# Patient Record
Sex: Female | Born: 1950 | Race: Black or African American | Hispanic: No | Smoking: Current every day smoker
Health system: Southern US, Community
[De-identification: ages and names within clinical notes are randomized; demographics above are authoritative.]

## PROBLEM LIST (undated history)

## (undated) DIAGNOSIS — S069X9A Unspecified intracranial injury with loss of consciousness of unspecified duration, initial encounter: Secondary | ICD-10-CM

## (undated) DIAGNOSIS — R569 Unspecified convulsions: Secondary | ICD-10-CM

## (undated) DIAGNOSIS — S069XAA Unspecified intracranial injury with loss of consciousness status unknown, initial encounter: Secondary | ICD-10-CM

---

## 2014-02-05 ENCOUNTER — Emergency Department (HOSPITAL_COMMUNITY): Payer: Medicaid Other

## 2014-02-05 ENCOUNTER — Encounter (HOSPITAL_COMMUNITY): Payer: Self-pay | Admitting: Emergency Medicine

## 2014-02-05 ENCOUNTER — Observation Stay (HOSPITAL_COMMUNITY): Payer: Medicaid Other

## 2014-02-05 ENCOUNTER — Observation Stay (HOSPITAL_COMMUNITY)
Admission: EM | Admit: 2014-02-05 | Discharge: 2014-02-06 | Disposition: A | Discharge status: Home / Self Care | Payer: Medicaid Other | Attending: Internal Medicine | Admitting: Internal Medicine

## 2014-02-05 DIAGNOSIS — R030 Elevated blood-pressure reading, without diagnosis of hypertension: Secondary | ICD-10-CM | POA: Diagnosis not present

## 2014-02-05 DIAGNOSIS — IMO0001 Reserved for inherently not codable concepts without codable children: Secondary | ICD-10-CM

## 2014-02-05 DIAGNOSIS — Z87820 Personal history of traumatic brain injury: Secondary | ICD-10-CM | POA: Diagnosis not present

## 2014-02-05 DIAGNOSIS — F17200 Nicotine dependence, unspecified, uncomplicated: Secondary | ICD-10-CM | POA: Insufficient documentation

## 2014-02-05 DIAGNOSIS — G40309 Generalized idiopathic epilepsy and epileptic syndromes, not intractable, without status epilepticus: Secondary | ICD-10-CM | POA: Diagnosis not present

## 2014-02-05 DIAGNOSIS — G40901 Epilepsy, unspecified, not intractable, with status epilepticus: Secondary | ICD-10-CM

## 2014-02-05 DIAGNOSIS — R569 Unspecified convulsions: Secondary | ICD-10-CM | POA: Diagnosis present

## 2014-02-05 DIAGNOSIS — G40401 Other generalized epilepsy and epileptic syndromes, not intractable, with status epilepticus: Secondary | ICD-10-CM

## 2014-02-05 DIAGNOSIS — R561 Post traumatic seizures: Secondary | ICD-10-CM

## 2014-02-05 DIAGNOSIS — M62421 Contracture of muscle, right upper arm: Secondary | ICD-10-CM

## 2014-02-05 HISTORY — DX: Unspecified intracranial injury with loss of consciousness status unknown, initial encounter: S06.9XAA

## 2014-02-05 HISTORY — DX: Unspecified intracranial injury with loss of consciousness of unspecified duration, initial encounter: S06.9X9A

## 2014-02-05 HISTORY — DX: Unspecified convulsions: R56.9

## 2014-02-05 LAB — URINALYSIS, ROUTINE W REFLEX MICROSCOPIC
BILIRUBIN URINE: NEGATIVE
Glucose, UA: NEGATIVE mg/dL
HGB URINE DIPSTICK: NEGATIVE
Ketones, ur: NEGATIVE mg/dL
Leukocytes, UA: NEGATIVE
Nitrite: NEGATIVE
PROTEIN: NEGATIVE mg/dL
Specific Gravity, Urine: 1.008 (ref 1.005–1.030)
Urobilinogen, UA: 0.2 mg/dL (ref 0.0–1.0)
pH: 8 (ref 5.0–8.0)

## 2014-02-05 LAB — CBC WITH DIFFERENTIAL/PLATELET
Basophils Absolute: 0 10*3/uL (ref 0.0–0.1)
Basophils Relative: 0 % (ref 0–1)
Eosinophils Absolute: 0 10*3/uL (ref 0.0–0.7)
Eosinophils Relative: 1 % (ref 0–5)
HCT: 44.7 % (ref 36.0–46.0)
HEMOGLOBIN: 14.7 g/dL (ref 12.0–15.0)
LYMPHS ABS: 1.4 10*3/uL (ref 0.7–4.0)
Lymphocytes Relative: 18 % (ref 12–46)
MCH: 32.1 pg (ref 26.0–34.0)
MCHC: 32.9 g/dL (ref 30.0–36.0)
MCV: 97.6 fL (ref 78.0–100.0)
Monocytes Absolute: 0.3 10*3/uL (ref 0.1–1.0)
Monocytes Relative: 4 % (ref 3–12)
NEUTROS PCT: 77 % (ref 43–77)
Neutro Abs: 6.1 10*3/uL (ref 1.7–7.7)
PLATELETS: 258 10*3/uL (ref 150–400)
RBC: 4.58 MIL/uL (ref 3.87–5.11)
RDW: 14 % (ref 11.5–15.5)
WBC: 7.9 10*3/uL (ref 4.0–10.5)

## 2014-02-05 LAB — COMPREHENSIVE METABOLIC PANEL
ALK PHOS: 83 U/L (ref 39–117)
ALT: 6 U/L (ref 0–35)
AST: 11 U/L (ref 0–37)
Albumin: 4.3 g/dL (ref 3.5–5.2)
Anion gap: 16 — ABNORMAL HIGH (ref 5–15)
BILIRUBIN TOTAL: 0.3 mg/dL (ref 0.3–1.2)
BUN: 6 mg/dL (ref 6–23)
CO2: 25 meq/L (ref 19–32)
Calcium: 9.4 mg/dL (ref 8.4–10.5)
Chloride: 99 mEq/L (ref 96–112)
Creatinine, Ser: 0.71 mg/dL (ref 0.50–1.10)
GLUCOSE: 94 mg/dL (ref 70–99)
POTASSIUM: 3.7 meq/L (ref 3.7–5.3)
SODIUM: 140 meq/L (ref 137–147)
Total Protein: 7.6 g/dL (ref 6.0–8.3)

## 2014-02-05 MED ORDER — METOPROLOL TARTRATE 25 MG PO TABS: 50.0000 mg | ORAL_TABLET | Freq: Once | ORAL | Status: DC

## 2014-02-05 MED ORDER — METOPROLOL TARTRATE 1 MG/ML IV SOLN
5.0000 mg | Freq: Once | INTRAVENOUS | Status: AC
  Administered 2014-02-05: 5 mg via INTRAVENOUS
  Filled 2014-02-05: qty 5

## 2014-02-05 MED ORDER — SODIUM CHLORIDE 0.9 % IV SOLN
INTRAVENOUS | Status: AC
  Administered 2014-02-05: via INTRAVENOUS

## 2014-02-05 MED ORDER — SODIUM CHLORIDE 0.9 % IJ SOLN
3.0000 mL | Freq: Two times a day (BID) | INTRAMUSCULAR | Status: DC
  Administered 2014-02-05: 3 mL via INTRAVENOUS

## 2014-02-05 MED ORDER — LEVETIRACETAM 500 MG PO TABS
1000.0000 mg | ORAL_TABLET | Freq: Three times a day (TID) | ORAL | Status: DC
  Administered 2014-02-06 (×2): 1000 mg via ORAL
  Filled 2014-02-05 (×2): qty 2

## 2014-02-05 MED ORDER — LORAZEPAM 2 MG/ML IJ SOLN: 1.0000 mg | INTRAMUSCULAR | Status: DC | PRN

## 2014-02-05 MED ORDER — ENOXAPARIN SODIUM 40 MG/0.4ML ~~LOC~~ SOLN
40.0000 mg | SUBCUTANEOUS | Status: DC
  Administered 2014-02-06: 40 mg via SUBCUTANEOUS
  Filled 2014-02-05: qty 0.4

## 2014-02-05 MED ORDER — LEVETIRACETAM IN NACL 1000 MG/100ML IV SOLN
1000.0000 mg | Freq: Once | INTRAVENOUS | Status: AC
  Administered 2014-02-05: 1000 mg via INTRAVENOUS
  Filled 2014-02-05: qty 100

## 2014-02-05 MED ORDER — ONDANSETRON HCL 4 MG PO TABS: 4.0000 mg | ORAL_TABLET | Freq: Four times a day (QID) | ORAL | Status: DC | PRN

## 2014-02-05 MED ORDER — LORAZEPAM 2 MG/ML IJ SOLN
1.0000 mg | Freq: Once | INTRAMUSCULAR | Status: AC
  Administered 2014-02-05: 1 mg via INTRAVENOUS
  Filled 2014-02-05: qty 1

## 2014-02-05 MED ORDER — LORAZEPAM 2 MG/ML IJ SOLN
1.0000 mg | Freq: Once | INTRAMUSCULAR | Status: AC
  Administered 2014-02-05: 1 mg via INTRAVENOUS

## 2014-02-05 MED ORDER — ONDANSETRON HCL 4 MG/2ML IJ SOLN: 4.0000 mg | Freq: Four times a day (QID) | INTRAMUSCULAR | Status: DC | PRN

## 2014-02-05 NOTE — ED Provider Notes (Signed)
CSN: [id number]     Arrival date & time 02/05/14  1730 History   First MD Initiated Contact with Patient 02/05/14 1735     Chief Complaint  Patient presents with  . Seizures     (Consider location/radiation/quality/duration/timing/severity/associated sxs/prior Treatment) HPI Comments: Patient is a 63 year old female with history of traumatic brain injury many years ago, seizure disorder. She is being treated with Keppra and has been seizure-free for quite some time. She is brought today for evaluation of seizure that occurred at home. EMS was called and the patient was given Versed with resolution. Shortly after arriving here she experienced another episode and was given Ativan. 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  Patient is a 63 y.o. female presenting with seizures. The history is provided by the patient.  Seizures Seizure activity on arrival: no   Seizure type:  Grand mal Initial focality:  None Episode characteristics: confusion, focal shaking, incontinence and stiffening   Severity:  Moderate Duration:  5 minutes Timing:  Clustered Progression:  Worsening   Past Medical History  Diagnosis Date  . Seizures   . TBI (traumatic brain injury)    No past surgical history on file. No family history on file. History  Substance Use Topics  . Smoking status: Not on file  . Smokeless tobacco: Not on file  . Alcohol Use: Not on file   OB History   Grav Para Term Preterm Abortions TAB SAB Ect Mult Living                 Review of Systems  Neurological: Positive for seizures.  All other systems reviewed and are negative.     Allergies  Review of patient's allergies indicates no known allergies.  Home Medications   Prior to Admission medications   Medication Sig Start Date End Date Taking? Authorizing Provider  levETIRAcetam (KEPPRA) 750 MG tablet Take 750 mg by mouth 3 (three) times daily.   Yes Historical Provider,  MD   BP 193/100  Pulse 111  Temp(Src) 97.9 F (36.6 C) (Oral)  Resp 28  SpO2 94% Physical Exam  Nursing note and vitals reviewed. Constitutional: She is oriented to person, place, and time.  Patient is a 63 year old female in no acute distress. She appears chronically ill.  HENT:  Head: Normocephalic and atraumatic.  Mouth/Throat: Oropharynx is clear and moist.  Eyes: Pupils are equal, round, and reactive to light.  Neck: Normal range of motion. Neck supple.  Cardiovascular: Normal rate, regular rhythm and normal heart sounds.   No murmur heard. Pulmonary/Chest: Effort normal and breath sounds normal. No respiratory distress.  Abdominal: Soft. Bowel sounds are normal. She exhibits no distension. There is no tenderness.  Musculoskeletal: Normal range of motion. She exhibits no edema.  Neurological: She is alert and oriented to person, place, and time. No cranial nerve deficit.  Patient has a baseline hemiplegia on the right. Neurologic exam is somewhat difficult due to to postictal state.  Skin: Skin is warm and dry.    ED Course  Procedures (including critical care time) Labs Review Labs Reviewed  COMPREHENSIVE METABOLIC PANEL - Abnormal; Notable for the following:    Anion gap 16 (*)    All other components within normal limits  CBC WITH DIFFERENTIAL  URINALYSIS, ROUTINE W REFLEX MICROSCOPIC    Imaging Review Ct Head Wo Contrast  02/05/2014   CLINICAL DATA:  SEIZURES  EXAM: CT HEAD WITHOUT CONTRAST  TECHNIQUE: Contiguous axial images  were obtained from the base of the skull through the vertex without intravenous contrast.  COMPARISON:  10/13/2010  FINDINGS: Atherosclerotic and physiologic intracranial calcifications. Previous left parietal craniotomy. Stable encephalomalacia in the left posterior frontal, temporal, and parietal regions .  There is no evidence of acute intracranial hemorrhage, brain edema, mass lesion, acute infarction, mass effect, or midline shift. Acute  infarct may be inapparent on noncontrast CT. No other intra-axial abnormalities are seen. No abnormal extra-axial fluid collections or masses are identified. No significant calvarial abnormality.  IMPRESSION: 1. Negative for bleed or other acute intracranial process. 2. Changes of left parietal craniotomy with stable underlying encephalomalacia.   Electronically Signed   By: Oley Balmaniel  Hassell M.D.   On: 02/05/2014 21:17     EKG Interpretation None      MDM   Final diagnoses:  None    The patient has had recurrent seizures since arriving in the emergency department. She's been given multiple doses of Ativan. I found no obvious reason for her dramatic increase in seizure activity this evening. Her laboratory studies are unremarkable and CT of the head is unremarkable as well. There is no evidence for infection in the urine and chest x-ray is pending. Patient has been evaluated by Dr. Amada JupiterKirkpatrick from neurology who feels as though admission to medicine is indicated.  CRITICAL CARE Performed by: Geoffery LyonseLo, Landyn Buckalew Total critical care time: 45 minutes Critical care time was exclusive of separately billable procedures and treating other patients. Critical care was necessary to treat or prevent imminent or life-threatening deterioration. Critical care was time spent personally by me on the following activities: development of treatment plan with patient and/or surrogate as well as nursing, discussions with consultants, evaluation of patient's response to treatment, examination of patient, obtaining history from patient or surrogate, ordering and performing treatments and interventions, ordering and review of laboratory studies, ordering and review of radiographic studies, pulse oximetry and re-evaluation of patient's condition.    Geoffery Lyonsouglas Rakel Junio, MD 02/05/14 2213

## 2014-02-05 NOTE — ED Notes (Signed)
EDP at bedside  

## 2014-02-05 NOTE — Consult Note (Signed)
Neurology Consultation Reason for Consult: Seizures Referring Physician: Judd Lien, D  CC: Seizures  History is obtained from: Patient  HPI: Rebecca Mccormick is a 63 y.o. female with a history of seizure disorder secondary to previous traumatic brain injury who presents with recurrent seizures. She currently takes Keppra 750 3 times a day. She has not missed any doses.  Today, she had several seizures. She was found at home having shaking of both legs and EMS was called. After EMS arrival, she is continuing to have seizures and was given 2.5 mg of Versed with resolution the seizure. After arrival to the emergency department, she again had seizure activity and was given 1 mg IV Ativan. She she then had another seizure, and another milligram of IV Ativan was given again with resolution.  She currently remains postictal, but is improving  and following commands.  ROS:  Unable to obtain due to altered mental status.   Past Medical History  Diagnosis Date  . Seizures   . TBI (traumatic brain injury)     Family History: Unable to assess secondary to patient's altered mental status.    Social History: Tob: Unable to assess secondary to patient's altered mental status.    Exam: Current vital signs: BP 190/102  Pulse 88  Temp(Src) 97.9 F (36.6 C) (Oral)  Resp 23  SpO2 98% Vital signs in last 24 hours: Temp:  [97.9 F (36.6 C)] 97.9 F (36.6 C) (07/24 1744) Pulse Rate:  [83-136] 88 (07/24 2216) Resp:  [15-28] 23 (07/24 2216) BP: (156-254)/(86-148) 190/102 mmHg (07/24 2216) SpO2:  [92 %-98 %] 98 % (07/24 2216)  General: In bed, NAD CV: Regular rate and rhythm Mental Status: Patient is drowsy but easily arousable, she briskly follows commands with left side including showing 2 fingers and showing thumbsup. She does not follow commands with the right side. She does stick out her tongue to command. Her speech is dysarthric and I am unable to understand her. She does not clearly nod  or shake head in response to questions. Cranial Nerves: II: She blinks to threat from the left but not right. Pupils are equal, round, and reactive to light.  Discs are difficult to visualize. III,IV, VI: She has a right gaze preference, but does cross midline to the left when fixating the examiner, but does not fully look to the left. V:VII: Blinks to eyelid stimulation bilaterally VIII, X, XI, XII: Unable to assess secondary to patient's altered mental status.  Motor: She has a spastic right hemiparesis, but does withdraw the right leg to noxious stimuli. She moves the left side spontaneously and appears to have good strength. Sensory: She responds to noxious stimuli in the right leg and both left arms Deep Tendon Reflexes: Difficult to elicit on the spastic side, no clonus, 2+ on the left side Cerebellar: She touches my finger without clear dysmetria, but is unable to touch her nose with her left arm do to multiple monitors and IVs. Gait: Not tested due to mental status   I have reviewed labs in epic and the results pertinent to this consultation are: CMP-unremarkable  I have reviewed the images obtained: CT head-previous encephalomalacia  Impression: 63 year old female with known seizure disorder from previous traumatic brain injury who presents with multiple recurrent seizures today. She has had a cough, and a URI could lower her seizure threshold and may be responsible for her worsening. She has some room to move on her Keppra, and I would favor maximizing this agent prior to  adding a second agent.  Recommendations: 1) Keppra 1 g 3 times a day 2) if further seizures occur, would give additional Ativan 1 mg IV, and would add a second agent. 3) neurology will continue to follow.  Rebecca SlotMcNeill Kirkpatrick, MD [facility] [phone]  If 7pm- 7am, please page neurology on call as listed in [facility].

## 2014-02-05 NOTE — ED Notes (Addendum)
Per EMS, pt has h/o seizures. Daughter called. Pt was laying on mattress,focal seizure, facial twitching, left sided gaze, left hip focal. Unknown of when seizure started. Pt responds to verbal. 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 Intial BP 176/108, HR 109, 93% Rm air, CBG 100. IV started in route, 18G LAC, 2.5mg  Versed given. Nasal airway placed in left nostril. EMS suctioned pt.

## 2014-02-05 NOTE — H&P (Signed)
Date: 02/05/2014               Patient Name:  Rebecca Mccormick MRN: [id number]  DOB: 27-Apr-1951 Age / Sex: 63 y.o., female   PCP: Mia Creek, MD         Medical Service: Internal Medicine Teaching Service         Attending Physician: Dr. Rocco Serene, MD    First Contact: Dr. Beckie Salts Pager: [phone]  Second Contact: Dr. Burtis Junes Pager: [phone]       After Hours (After 5p/  First Contact Pager: [phone]  weekends / holidays): Second Contact Pager: [phone]   Chief Complaint: Seizures  History of Present Illness: Ms. Sorto is a 63 y.o. Female with PMHx of TBI s/p L parietal craniotomy in 1997 and history of seizures as a result of TBI who presents to the ED after a seizure. HPI and ROS are taken from the pt's daughters. Pt's daughter walked into her mother's room around 4 pm and noticed she was seizing on her bed. Daughter describes seizure as a stiffening of her legs, facial twitching, with her head rotated to the right. The seizure lasted for about 8 minutes from the time it was initially witnessed. EMS was called and seizure broke sometime with EMS. Pt did not have any urinary or bowel incontinence. No fall, pt was already laying down. No biting of tongue, pt is edentulous. Pt has a seizure disorder since 1997 that is a result of her TBI. Pt's daughters states her last seizure was about 1 year ago and similar in nature. The inciting factor at the time was not know. She has had numerous seizures in the past, too many to count; however, she is currently well controlled on Keppra 750 mg TID at home. Daughters state that she is very compliant with her medication. Pt smokes tobacco 1/2 ppd, but denies any alcohol or illicit drug use. Pt has had a cold for the past few days with a productive cough and possible fever.   Meds: Current Facility-Administered Medications  Medication Dose Route Frequency Provider Last Rate Last Dose  . 0.9 %  sodium chloride infusion   Intravenous Continuous Courtney Paris, MD      . enoxaparin (LOVENOX) injection 40 mg  40 mg Subcutaneous Q24H Courtney Paris, MD      . Melene Muller ON 02/06/2014] levETIRAcetam (KEPPRA) tablet 1,000 mg  1,000 mg Oral TID Courtney Paris, MD      . LORazepam (ATIVAN) injection 1-2 mg  1-2 mg Intravenous Q2H PRN Courtney Paris, MD      . ondansetron [facility]) tablet 4 mg  4 mg Oral Q6H PRN Courtney Paris, MD       Or  . ondansetron [facility]) injection 4 mg  4 mg Intravenous Q6H PRN Courtney Paris, MD      . sodium chloride 0.9 % injection 3 mL  3 mL Intravenous Q12H Courtney Paris, MD       Current Outpatient Prescriptions  Medication Sig Dispense Refill  . levETIRAcetam (KEPPRA) 750 MG tablet Take 750 mg by mouth 3 (three) times daily.        Allergies: Allergies as of 02/05/2014  . (No Known Allergies)   Past Medical History  Diagnosis Date  . Seizures   . TBI (traumatic brain injury)    No past surgical history on file. No family history on file. History   Social History  . Marital Status: Legally Separated  Spouse Name: N/A    Number of Children: N/A  . Years of Education: N/A   Occupational History  . Not on file.   Social History Main Topics  . Smoking status: Not on file  . Smokeless tobacco: Not on file  . Alcohol Use: Not on file  . Drug Use: Not on file  . Sexual Activity: Not on file   Other Topics Concern  . Not on file   Social History Narrative  . No narrative on file    Review of Systems: Obtained from daughters General:  Admits to fever, Denies chills, diaphoresis, appetite change and fatigue.  Respiratory: Admits to productive cough, Denies SOB,  and wheezing.   Cardiovascular: Denies chest pain and palpitations.  Gastrointestinal: Denies nausea, vomiting, abdominal pain, diarrhea, constipation, blood in stool and abdominal distention.  Genitourinary: Denies dysuria, urgency, frequency, hematuria, and flank pain. Endocrine: Denies hot or cold intolerance, polyuria, and  polydipsia. Musculoskeletal: Denies myalgias, back pain, joint swelling, arthralgias and gait problem.  Skin: Denies pallor, rash and wounds.  Neurological: Admits to seizure. Denies dizziness, fall, syncope, weakness, lightheadedness, numbness and headaches.  Psychiatric/Behavioral: Denies mood changes, confusion, nervousness, sleep disturbance and agitation.  Physical Exam: Filed Vitals:   02/05/14 2145 02/05/14 2200 02/05/14 2216 02/05/14 2230  BP: 185/105 197/93 190/102 193/96  Pulse: 107 102 88 86  Temp:      TempSrc:      Resp: 28 28 23 22   SpO2: 95% 94% 98% 97%   General: Vital signs reviewed.  Patient is a well-developed and well-nourished, in no acute distress and lethargic during exam.  Head: Normocephalic and atraumatic. Eyes: EOMI, conjunctivae normal, No scleral icterus.  Neck: Supple, trachea midline, normal ROM, No JVD, masses, thyromegaly, or carotid bruit present.  Cardiovascular: RRR, S1 normal, S2 normal, no murmurs, gallops, or rubs. Pulmonary/Chest: Mild upper airway rhonchi,  Good air movement, no wheezes, rales. Abdominal: Soft, non-tender, non-distended, BS +, no masses, organomegaly, or guarding present.  Musculoskeletal: R upper extremity contracture held in flexion at the elbow 2/2 to TBI. No joint deformities, erythema, or stiffness, ROM full and nontender. Extremities: No lower extremity edema bilaterally,  pulses symmetric and intact bilaterally. No cyanosis or clubbing. Neurological: Lethargic, pt is arousable, but does not communicate with us. She follows commands. Strength is 4/5 in upper and lower left extremities, 0/5 in upper right extremity. 4/5 on right lower extremity.  Cranial nerve II-XII are grossly intact, no focal motor deficit, sensory intact to light touch bilaterally.  Skin: Warm, dry and intact. No rashes or erythema. Psychiatric: Normal mood and affect. behavior is normal. Cognition and memory are normal.   Lab results: Basic Metabolic  Panel:  Recent Labs  02/05/14 1826  NA 140  K 3.7  CL 99  CO2 25  GLUCOSE 94  BUN 6  CREATININE 0.71  CALCIUM 9.4   Liver Function Tests:  Recent Labs  02/05/14 1826  AST 11  ALT 6  ALKPHOS 83  BILITOT 0.3  PROT 7.6  ALBUMIN 4.3   CBC:  Recent Labs  02/05/14 1826  WBC 7.9  NEUTROABS 6.1  HGB 14.7  HCT 44.7  MCV 97.6  PLT 258   Urinalysis:  Recent Labs  02/05/14 1840  COLORURINE YELLOW  LABSPEC 1.008  PHURINE 8.0  GLUCOSEU NEGATIVE  HGBUR NEGATIVE  BILIRUBINUR NEGATIVE  KETONESUR NEGATIVE  PROTEINUR NEGATIVE  UROBILINOGEN 0.2  NITRITE NEGATIVE  LEUKOCYTESUR NEGATIVE   Imaging results:  Dg Chest 2 View  02/05/2014   CLINICAL DATA:  Seizures.  EXAM: CHEST  2 VIEW  COMPARISON:  None.  FINDINGS: The lungs are clear without focal infiltrate, edema, pneumothorax or pleural effusion. Cardiopericardial silhouette is at upper limits of normal for size. Bones are diffusely demineralized.  IMPRESSION: No acute cardiopulmonary findings.   Electronically Signed   By: Kennith Center M.D.   On: 02/05/2014 23:31   Ct Head Wo Contrast  02/05/2014   CLINICAL DATA:  SEIZURES  EXAM: CT HEAD WITHOUT CONTRAST  TECHNIQUE: Contiguous axial images were obtained from the base of the skull through the vertex without intravenous contrast.  COMPARISON:  10/13/2010  FINDINGS: Atherosclerotic and physiologic intracranial calcifications. Previous left parietal craniotomy. Stable encephalomalacia in the left posterior frontal, temporal, and parietal regions .  There is no evidence of acute intracranial hemorrhage, brain edema, mass lesion, acute infarction, mass effect, or midline shift. Acute infarct may be inapparent on noncontrast CT. No other intra-axial abnormalities are seen. No abnormal extra-axial fluid collections or masses are identified. No significant calvarial abnormality.  IMPRESSION: 1. Negative for bleed or other acute intracranial process. 2. Changes of left parietal  craniotomy with stable underlying encephalomalacia.   Electronically Signed   By: Oley Balm M.D.   On: 02/05/2014 21:17    Assessment & Plan by Problem:  Seizure: Pt presents with focal seizure lasting for about 8 minutes since the seizure was first witnessed. No urinary or bowel incontinence. No fall. Pt has a history of seizure disorder 2/2 to TBI in 1997. Normally well controlled on keppra 750 mg TID. Pt has been compliant with medications. Last seizure was about a year ago. Denies alcohol or illicit drug use. Neurology was consulted and pt was seen by Dr. Amada Jupiter. Pt's daughters state that the pt has had a cold for the past few days with a productive cough. Seizure could be a result of a viral illness since viruses decrease seizure threshold. We are ruling out other infectious etiologies. Pt is afebrile T 97.9. No leukocytosis with WBC of 7.9. UA showed negative nitrites, negative leukocytes, negative hemoglobin. No abdominal pain or suprapubic pain. CXR shows no acute cardiopulmonary findings. CVA or TIA unlikely. Symptoms sound like a seizure in nature with stiffness and spasms lasting for several minutes and due to her seizure disorder. This seizure was similar in nature to her previous seizures. CT head w/o contrast was negative for bleed or other acute intracranial process. Positive changes of left parietal craniotomy with stable underlying encephalomalacia. Pt was given Versed 2.5 mg by EMS and received Ativan 1 mg IV x 2 in the ED along with a dose of Keppra 1000 mg IV once. Pt somnolence most likely due to benzodiazepines, post-ictal state and fatigue. -Lorazepam 1-2 mg IV Q2H prn -Keppra 1000 mg po TID daily -NS 100 mL/hr -Consult to neurology -Seizure precautions -Neuro checks -NPO -EKG -Bed rest -Cardiac monitoring -CBC, BMP, magnesium, CK  Elevated Blood Pressure: BP 156/98 HR 91 on admission. Up to 225/120 at the highest in the ED. Given metoprolol 5 mg IV once. BP 193/96  currently. Pt does not have an official diagnosis of hypertension and doesn't take blood pressure medications at home.  -Monitor -If HTN continues, we will treat -EKG -Cardiac monitor  DVT/PE ppx: Lovenox 40 mg SQ daily  Dispo: Disposition is deferred at this time, awaiting improvement of current medical problems. Anticipated discharge in approximately 1-2 day(s).   The patient does have a current PCP Mia Creek, MD) and  does not need an [facility] hospital follow-up appointment after discharge.  The patient does not have transportation limitations that hinder transportation to clinic appointments.  Signed: Jill Alexanders, DO PGY-1 Internal Medicine Resident Pager # [phone] 02/05/2014 11:42 PM

## 2014-02-05 NOTE — ED Notes (Signed)
Nasal airway removed

## 2014-02-06 ENCOUNTER — Encounter (HOSPITAL_COMMUNITY): Payer: Self-pay | Admitting: *Deleted

## 2014-02-06 DIAGNOSIS — R569 Unspecified convulsions: Secondary | ICD-10-CM

## 2014-02-06 DIAGNOSIS — R561 Post traumatic seizures: Secondary | ICD-10-CM

## 2014-02-06 LAB — MAGNESIUM: Magnesium: 1.7 mg/dL (ref 1.5–2.5)

## 2014-02-06 LAB — BASIC METABOLIC PANEL
ANION GAP: 16 — AB (ref 5–15)
BUN: 6 mg/dL (ref 6–23)
CHLORIDE: 99 meq/L (ref 96–112)
CO2: 24 mEq/L (ref 19–32)
Calcium: 9.2 mg/dL (ref 8.4–10.5)
Creatinine, Ser: 0.63 mg/dL (ref 0.50–1.10)
GFR calc Af Amer: >90 mL/min (ref >90)
GFR calc non Af Amer: >90 mL/min (ref >90)
Glucose, Bld: 115 mg/dL — ABNORMAL HIGH (ref 70–99)
POTASSIUM: 3.6 meq/L — AB (ref 3.7–5.3)
Sodium: 139 mEq/L (ref 137–147)

## 2014-02-06 MED ORDER — POTASSIUM CHLORIDE CRYS ER 20 MEQ PO TBCR
40.0000 meq | EXTENDED_RELEASE_TABLET | Freq: Once | ORAL | Status: AC
  Administered 2014-02-06: 40 meq via ORAL
  Filled 2014-02-06: qty 2

## 2014-02-06 MED ORDER — LEVETIRACETAM 1000 MG PO TABS
1000.0000 mg | ORAL_TABLET | Freq: Three times a day (TID) | ORAL | Status: AC
Start: 2014-02-06 — End: ?

## 2014-02-06 MED ORDER — HYDRALAZINE HCL 20 MG/ML IJ SOLN
2.0000 mg | Freq: Once | INTRAMUSCULAR | Status: AC
  Administered 2014-02-06: 2 mg via INTRAVENOUS
  Filled 2014-02-06: qty 1

## 2014-02-06 NOTE — Progress Notes (Signed)
Subjective: No recurrence of seizure activity. Patient had no complaints.  Objective: Current vital signs: BP 157/81  Pulse 98  Temp(Src) 98.6 F (37 C) (Oral)  Resp 18  SpO2 93%  Neurologic Exam: Alert and in no acute distress. Patient is well-oriented to time as well as place. Patient was able to communicate with writing of words more easily than with speech. She's aware that she had a recurrent seizure.  Medications: I have reviewed the patient's current medications.  Assessment/Plan: Recurrent seizure in patient with history of TBI and known seizure disorder. Patient appears to be doing well with the increase in Keppra following admission.  Recommend no changes in current management, including continuing Keppra at 1000 mg 3 times a day. No further neurodiagnostic studies are indicated.  I will sign off on her care at this point but remain available for followup evaluation if needed.  C.R. Roseanne RenoStewart, MD [facility] [phone]  02/06/2014  9:31 AM

## 2014-02-06 NOTE — Discharge Summary (Signed)
Name: Rebecca Mccormick MRN: [id number] DOB: 1950/07/20 63 y.o. PCP: Rebecca Creek, MD  Date of Admission: 02/05/2014  5:30 PM Date of Discharge: 02/07/2014 Attending Physician: Dr. Josem Mccormick  Discharge Diagnosis: 1. 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        Disposition and follow-up:   RebeccaMiyeko Mccormick was discharged from [facility] in Stable condition.  At the hospital follow up visit please address:  1.  Compliance with new keppra dose and further seizure activity, BP recheck for possible HTN diagnosis  2.  Labs / imaging needed at time of follow-up: none  3.  Pending labs/ test needing follow-up: none  Follow-up Appointments: Follow-up Information   Follow up with Mccormick, Rebecca C, MD In 1 week.   Specialty:  Internal Medicine   Contact information:   [address] [place] [address] [phone]       Discharge Instructions: Discharge Instructions   Increase activity slowly    Complete by:  As directed            Consultations: Treatment Team:  Rebecca Groom, MD  Procedures Performed:  Dg Chest 2 View  02/05/2014   CLINICAL DATA:  Seizures.  EXAM: CHEST  2 VIEW  COMPARISON:  None.  FINDINGS: The lungs are clear without focal infiltrate, edema, pneumothorax or pleural effusion. Cardiopericardial silhouette is at upper limits of normal for size. Bones are diffusely demineralized.  IMPRESSION: No acute cardiopulmonary findings.   Electronically Signed   By: Rebecca Mccormick M.D.   On: 02/05/2014 23:31   Ct Head Wo Contrast  02/05/2014   CLINICAL DATA:  SEIZURES  EXAM: CT HEAD WITHOUT CONTRAST  TECHNIQUE: Contiguous axial images were obtained from the base of the skull through the vertex without intravenous contrast.  COMPARISON:   10/13/2010  FINDINGS: Atherosclerotic and physiologic intracranial calcifications. Previous left parietal craniotomy. Stable encephalomalacia in the left posterior frontal, temporal, and parietal regions .  There is no evidence of acute intracranial hemorrhage, brain edema, mass lesion, acute infarction, mass effect, or midline shift. Acute infarct may be inapparent on noncontrast CT. No other intra-axial abnormalities are seen. No abnormal extra-axial fluid collections or masses are identified. No significant calvarial abnormality.  IMPRESSION: 1. Negative for bleed or other acute intracranial process. 2. Changes of left parietal craniotomy with stable underlying encephalomalacia.   Electronically Signed   By: Rebecca Mccormick M.D.   On: 02/05/2014 21:17   Admission HPI: Rebecca Mccormick is a 63 y.o. Female with PMHx of TBI s/p L parietal craniotomy in 1997 and history of seizures as a result of TBI who presents to the ED after a seizure. HPI and ROS are taken from the pt's daughters. Pt's daughter walked into her mother's room around 4 pm and noticed she was seizing on her bed. Daughter describes seizure as a stiffening of her legs, facial twitching, with her head rotated to the right. The seizure lasted for about 8 minutes from the time it was initially witnessed. EMS was called and seizure broke sometime with EMS. Pt did not have any urinary or bowel incontinence. No fall, pt was already laying down. No biting of tongue, pt is edentulous. 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 Pt's daughters states her last seizure was about 1 year ago and similar in nature. The inciting factor at the time was not know. She has had numerous seizures in the past, too many to count; however, she is currently well controlled on Keppra 750 mg TID at home. Daughters state that she is very compliant with her medication. Pt smokes tobacco 1/2 ppd, but denies any alcohol or illicit drug use. Pt has had a cold for  the past few days with a productive cough and possible fever.   Hospital Course by problem list: # Seizure in setting of previous TBI: Pt presented with witnessed seizure by pt daughter. Pt has a history of seizure disorder 2/2 to TBI in 1997. Normally well controlled on keppra 750 mg TID. Pt has been compliant with medications. Last seizure was about a year ago. UDS and EtOH levels negative. No other signs of infection. Neurology was consulted and pt was seen by Dr. Amada Mccormick whom recommended increase in Keppra to 1000mg  TID. CXR shows no acute cardiopulmonary findings. CVA or TIA were considered unlikely since pt had no focal neurological deficits along with CT head w/o contrast was negative for bleed or other acute intracranial process. Pt had no other seizures before discharge. Pt will also be staying with daughter to monitor safety.   # Elevated Blood Pressure- resolved: Pt was initially admitted with some elevated BP in SBP 200s right after seizure. Pt was given IV hydralazine and then pt remained with well controlled SBP 130s. This should be re-evaluated as an outpt as pt doesn't have a diagnosis of HTN and may need future treatment.    Discharge Vitals:   BP 136/68  Pulse 90  Temp(Src) 98.2 F (36.8 C) (Oral)  Resp 18  SpO2 98% General: resting in bed, NAD, some stressed speech  HEENT: PERRL, EOMI, no scleral icterus  Cardiac: RRR, no rubs, murmurs or gallops  Pulm: clear to auscultation bilaterally, no crackles, wheezes, or rhonchi, moving normal volumes of air  Abd: soft, nontender, nondistended, BS present  Ext: warm and well perfused, no pedal edema  Neuro: alert and oriented X3, cranial nerves II-XII grossly intact, right arm contracture,   Discharge Labs:  Basic Metabolic Panel:  Recent Labs  11/91/4707/24/15 1826 02/06/14 0148  NA 140 139  K 3.7 3.6*  CL 99 99  CO2 25 24  GLUCOSE 94 115*  BUN 6 6  CREATININE 0.71 0.63  CALCIUM 9.4 9.2  MG  --  1.7   Liver Function  Tests:  Recent Labs  02/05/14 1826  AST 11  ALT 6  ALKPHOS 83  BILITOT 0.3  PROT 7.6  ALBUMIN 4.3   CBC:  Recent Labs  02/05/14 1826  WBC 7.9  NEUTROABS 6.1  HGB 14.7  HCT 44.7  MCV 97.6  PLT 258   Urinalysis:  Recent Labs  02/05/14 1840  COLORURINE YELLOW  LABSPEC 1.008  PHURINE 8.0  GLUCOSEU NEGATIVE  HGBUR NEGATIVE  BILIRUBINUR NEGATIVE  KETONESUR NEGATIVE  PROTEINUR NEGATIVE  UROBILINOGEN 0.2  NITRITE NEGATIVE  LEUKOCYTESUR NEGATIVE    Signed: Christen BameNora Elaine Middleton, MD 02/07/2014, 11:26 AM    Services Ordered on Discharge: none Equipment Ordered on Discharge: none

## 2014-02-06 NOTE — Care Management Utilization Note (Signed)
UR completed.    Jeptha Hinnenkamp Wise Lyrah Bradt, RN, BSN Phone #[phone]  

## 2014-02-06 NOTE — H&P (Signed)
Internal Medicine Attending Admission Note Date: 02/06/2014  Patient name: Rebecca Mccormick Medical record number: [id number] Date of birth: 08/21/1950 Age: 63 y.o. Gender: female  I saw and evaluated the patient. I reviewed the resident's note and I agree with the resident's findings and plan as documented in the resident's note.  Chief Complaint(s): Seizures  History - key components related to admission:  Ms. Rebecca Mccormick is a 63 year old woman with a history of a traumatic brain injury in 1997 and is status post a left parietal craniotomy and history of subsequent seizures who presents to the emergency department with recurrent seizures. The patient's daughter found her mother seizing at 4 PM on the evening of admission. The seizures were described as stiffening of the legs, facial twitching, and rotation of the head to the right. She estimated the time the seizure lasted was 8 minutes. EMS was called and she had several subsequent seizures treated with Ativan. She was admitted to the internal medicine teaching service for further evaluation and care. There was no urinary or bowel incontinence but the patient had some postictal confusion. Her last seizure was approximately one year ago and she has been maintained on Keppra 750 mg 3 times a day in the interim. The patient has had a cold for the last few days, but there have been no other unusual circumstances. She does smoke half a pack a day, but denies using alcohol or illegal drugs.  On rounds this morning she was feeling well. She has much difficulty expressing herself via speech, but this is believed to be her baseline after her traumatic brain injury 18 years ago. Nonetheless, she indicates she feels ready to return home.  Physical Exam - key components related to admission:  Filed Vitals:   02/05/14 2351 02/06/14 0208 02/06/14 0531 02/06/14 0943  BP: 188/91 170/100 157/81 135/64  Pulse: 82 83 98 96  Temp: 99.1 F (37.3 C) 98.6 F (37 C)  98.6 F (37 C) 98.5 F (36.9 C)  TempSrc: Oral Oral Oral Oral  Resp: 20 20 18 18   SpO2: 98% 100% 93% 96%   General: Well-developed, well-nourished, woman lying comfortably in bed in no acute distress. Neuro: She has difficulty expressing herself via speech. She is able to communicate yes and no. She moves 3 of her extremities spontaneously but has a contracture of her right upper extremity in a flexed position.  Lab results:  Basic Metabolic Panel:  Recent Labs  91/47/8207/24/15 1826 02/06/14 0148  NA 140 139  K 3.7 3.6*  CL 99 99  CO2 25 24  GLUCOSE 94 115*  BUN 6 6  CREATININE 0.71 0.63  CALCIUM 9.4 9.2  MG  --  1.7   Liver Function Tests:  Recent Labs  02/05/14 1826  AST 11  ALT 6  ALKPHOS 83  BILITOT 0.3  PROT 7.6  ALBUMIN 4.3   CBC:  Recent Labs  02/05/14 1826  WBC 7.9  NEUTROABS 6.1  HGB 14.7  HCT 44.7  MCV 97.6  PLT 258   Urinalysis:  Unremarkable  Imaging results:  Dg Chest 2 View  02/05/2014   CLINICAL DATA:  Seizures.  EXAM: CHEST  2 VIEW  COMPARISON:  None.  FINDINGS: The lungs are clear without focal infiltrate, edema, pneumothorax or pleural effusion. Cardiopericardial silhouette is at upper limits of normal for size. Bones are diffusely demineralized.  IMPRESSION: No acute cardiopulmonary findings.   Electronically Signed   By: Kennith CenterEric  Mansell M.D.   On: 02/05/2014 23:31  Ct Head Wo Contrast  02/05/2014   CLINICAL DATA:  SEIZURES  EXAM: CT HEAD WITHOUT CONTRAST  TECHNIQUE: Contiguous axial images were obtained from the base of the skull through the vertex without intravenous contrast.  COMPARISON:  10/13/2010  FINDINGS: Atherosclerotic and physiologic intracranial calcifications. Previous left parietal craniotomy. Stable encephalomalacia in the left posterior frontal, temporal, and parietal regions .  There is no evidence of acute intracranial hemorrhage, brain edema, mass lesion, acute infarction, mass effect, or midline shift. Acute infarct may be  inapparent on noncontrast CT. No other intra-axial abnormalities are seen. No abnormal extra-axial fluid collections or masses are identified. No significant calvarial abnormality.  IMPRESSION: 1. Negative for bleed or other acute intracranial process. 2. Changes of left parietal craniotomy with stable underlying encephalomalacia.   Electronically Signed   By: Oley Balm M.D.   On: 02/05/2014 21:17   Assessment & Plan by Problem:  Ms. Rebecca Mccormick is a 63 year old woman with a history of a traumatic brain injury in 1997 requiring a left parietal craniotomy and complicated by subsequent seizure disorder who presents with recurrent seizures. Her seizures have been well controlled on Keppra 750 mg 3 times a day, with the last being approximately one year ago. She has had a recent cold which may be secondary to a virus. This may have lowered her seizure threshold as there is no other explanation for the resumption of seizures at this time.  1) Seizures: Neurology has seen the patient and recommended an increase in her Keppra to 1000 mg 3 times daily. We have instituted this. Since she's been transported to the general medical ward she's had no further seizures. We will continue the Keppra at 1000 mg 3 times daily. She is stable for discharge home today.  2) Disposition: She will be discharged home today with followup by her primary care provider on the increased dose of Keppra.

## 2014-02-06 NOTE — Progress Notes (Signed)
Internal Medicine Attending  Date: 02/06/2014  Patient name: Rebecca Mccormick Medical record number: [id number] Date of birth: 1950-09-14 Age: 63 y.o. Gender: female  I saw and evaluated the patient. I developed the assessment and plan with the housestaff and reviewed the resident's note by Dr. Burtis JunesSadek.  I agree with the resident's findings and plans as documented in her progress note.

## 2014-02-06 NOTE — Progress Notes (Signed)
Ready for discharge home with her daughter; instructions given and review of Rx done; assisted patient with a bath and dressing; she remained alert threw out the day and participated without distress; discharged home.

## 2014-02-06 NOTE — Progress Notes (Addendum)
Subjective: [name]. Pt had no repeat seizures. Pt overall feels very well this AM. No complaints tolerating a regular diet w/o dysphagia, odynophagia, or choking.   Objective: Vital signs in last 24 hours: Filed Vitals:   02/05/14 2230 02/05/14 2351 02/06/14 0208 02/06/14 0531  BP: 193/96 188/91 170/100 157/81  Pulse: 86 82 83 98  Temp:  99.1 F (37.3 C) 98.6 F (37 C) 98.6 F (37 C)  TempSrc:  Oral Oral Oral  Resp: 22 20 20 18   SpO2: 97% 98% 100% 93%   Weight change:  No intake or output data in the 24 hours ending 02/06/14 0924 General: resting in bed, NAD, some stressed speech HEENT: PERRL, EOMI, no scleral icterus Cardiac: RRR, no rubs, murmurs or gallops Pulm: clear to auscultation bilaterally, no crackles, wheezes, or rhonchi, moving normal volumes of air Abd: soft, nontender, nondistended, BS present Ext: warm and well perfused, no pedal edema Neuro: alert and oriented X3, cranial nerves II-XII grossly intact, right arm contracture,   Lab Results: Basic Metabolic Panel:  Recent Labs Lab 02/05/14 1826 02/06/14 0148  NA 140 139  K 3.7 3.6*  CL 99 99  CO2 25 24  GLUCOSE 94 115*  BUN 6 6  CREATININE 0.71 0.63  CALCIUM 9.4 9.2  MG  --  1.7   Liver Function Tests:  Recent Labs Lab 02/05/14 1826  AST 11  ALT 6  ALKPHOS 83  BILITOT 0.3  PROT 7.6  ALBUMIN 4.3   CBC:  Recent Labs Lab 02/05/14 1826  WBC 7.9  NEUTROABS 6.1  HGB 14.7  HCT 44.7  MCV 97.6  PLT 258   Urinalysis:  Recent Labs Lab 02/05/14 1840  COLORURINE YELLOW  LABSPEC 1.008  PHURINE 8.0  GLUCOSEU NEGATIVE  HGBUR NEGATIVE  BILIRUBINUR NEGATIVE  KETONESUR NEGATIVE  PROTEINUR NEGATIVE  UROBILINOGEN 0.2  NITRITE NEGATIVE  LEUKOCYTESUR NEGATIVE   Micro Results: No results found for this or any previous visit (from the past 240 hour(s)). Studies/Results: Dg Chest 2 View  02/05/2014   CLINICAL DATA:  Seizures.  EXAM: CHEST  2 VIEW  COMPARISON:  None.  FINDINGS: The lungs  are clear without focal infiltrate, edema, pneumothorax or pleural effusion. Cardiopericardial silhouette is at upper limits of normal for size. Bones are diffusely demineralized.  IMPRESSION: No acute cardiopulmonary findings.   Electronically Signed   By: Kennith CenterEric  Mansell M.D.   On: 02/05/2014 23:31   Ct Head Wo Contrast  02/05/2014   CLINICAL DATA:  SEIZURES  EXAM: CT HEAD WITHOUT CONTRAST  TECHNIQUE: Contiguous axial images were obtained from the base of the skull through the vertex without intravenous contrast.  COMPARISON:  10/13/2010  FINDINGS: Atherosclerotic and physiologic intracranial calcifications. Previous left parietal craniotomy. Stable encephalomalacia in the left posterior frontal, temporal, and parietal regions .  There is no evidence of acute intracranial hemorrhage, brain edema, mass lesion, acute infarction, mass effect, or midline shift. Acute infarct may be inapparent on noncontrast CT. No other intra-axial abnormalities are seen. No abnormal extra-axial fluid collections or masses are identified. No significant calvarial abnormality.  IMPRESSION: 1. Negative for bleed or other acute intracranial process. 2. Changes of left parietal craniotomy with stable underlying encephalomalacia.   Electronically Signed   By: Oley Balmaniel  Hassell M.D.   On: 02/05/2014 21:17   Medications: I have reviewed the patient's current medications. Scheduled Meds: . enoxaparin (LOVENOX) injection  40 mg Subcutaneous Q24H  . levETIRAcetam  1,000 mg Oral 3 times per day  . sodium  chloride  3 mL Intravenous Q12H   Continuous Infusions: . sodium chloride 100 mL/hr at 02/05/14 2359   PRN Meds:.LORazepam, ondansetron (ZOFRAN) IV, ondansetron Assessment/Plan: # Seizure in setting of previous TBI: Pt presented with witnessed seizure by pt daughter. Pt has a history of seizure disorder 2/2 to TBI in 1997. Normally well controlled on keppra 750 mg TID. Pt has been compliant with medications. Last seizure was about a  year ago. UDS and EtOH levels negative. No other signs of infection. Neurology was consulted and pt was seen by Dr. Amada Jupiter. CXR shows no acute cardiopulmonary findings. CVA or TIA unlikely pt has no residual symptoms or focal neurological deficits this AM. CT head w/o contrast was negative for bleed or other acute intracranial process.  -prn Lorazepam 1-2 mg IV Q2H prn for breakthrough seizure -Keppra 1000 mg po TID daily (increased from 750mg  TID) -Neurology consult recs greatly appreciated -Seizure precautions  -regular diet  # Elevated Blood Pressure- resolved: initally elevated BP in SBP 200s and this AM well controlled SBP 130s.  -Monitor  -prn hydralazine SBP >180 or DBP >110   Dispo: Disposition is deferred at this time, awaiting improvement of current medical problems.  Anticipated discharge in approximately 1 day(s).   The patient does have a current PCP Rebecca Creek, MD) and does need an [facility] hospital follow-up appointment after discharge.  The patient does not have transportation limitations that hinder transportation to clinic appointments.  .Services Needed at time of discharge: Y = Yes, Blank = No PT:   OT:   RN:   Equipment:   Other:     LOS: 1 day   Christen Bame, MD 02/06/2014, 9:24 AM

## 2014-07-11 ENCOUNTER — Other Ambulatory Visit: Payer: Self-pay | Admitting: Internal Medicine

## 2014-07-15 ENCOUNTER — Other Ambulatory Visit: Payer: Self-pay | Admitting: Internal Medicine

## 2015-10-29 IMAGING — CR DG CHEST 2V
1 series · 1 of 1 positions shown · non-contrast
Comparison: None.

CLINICAL DATA: Seizures.

EXAM:
CHEST  2 VIEW

[view not recorded]
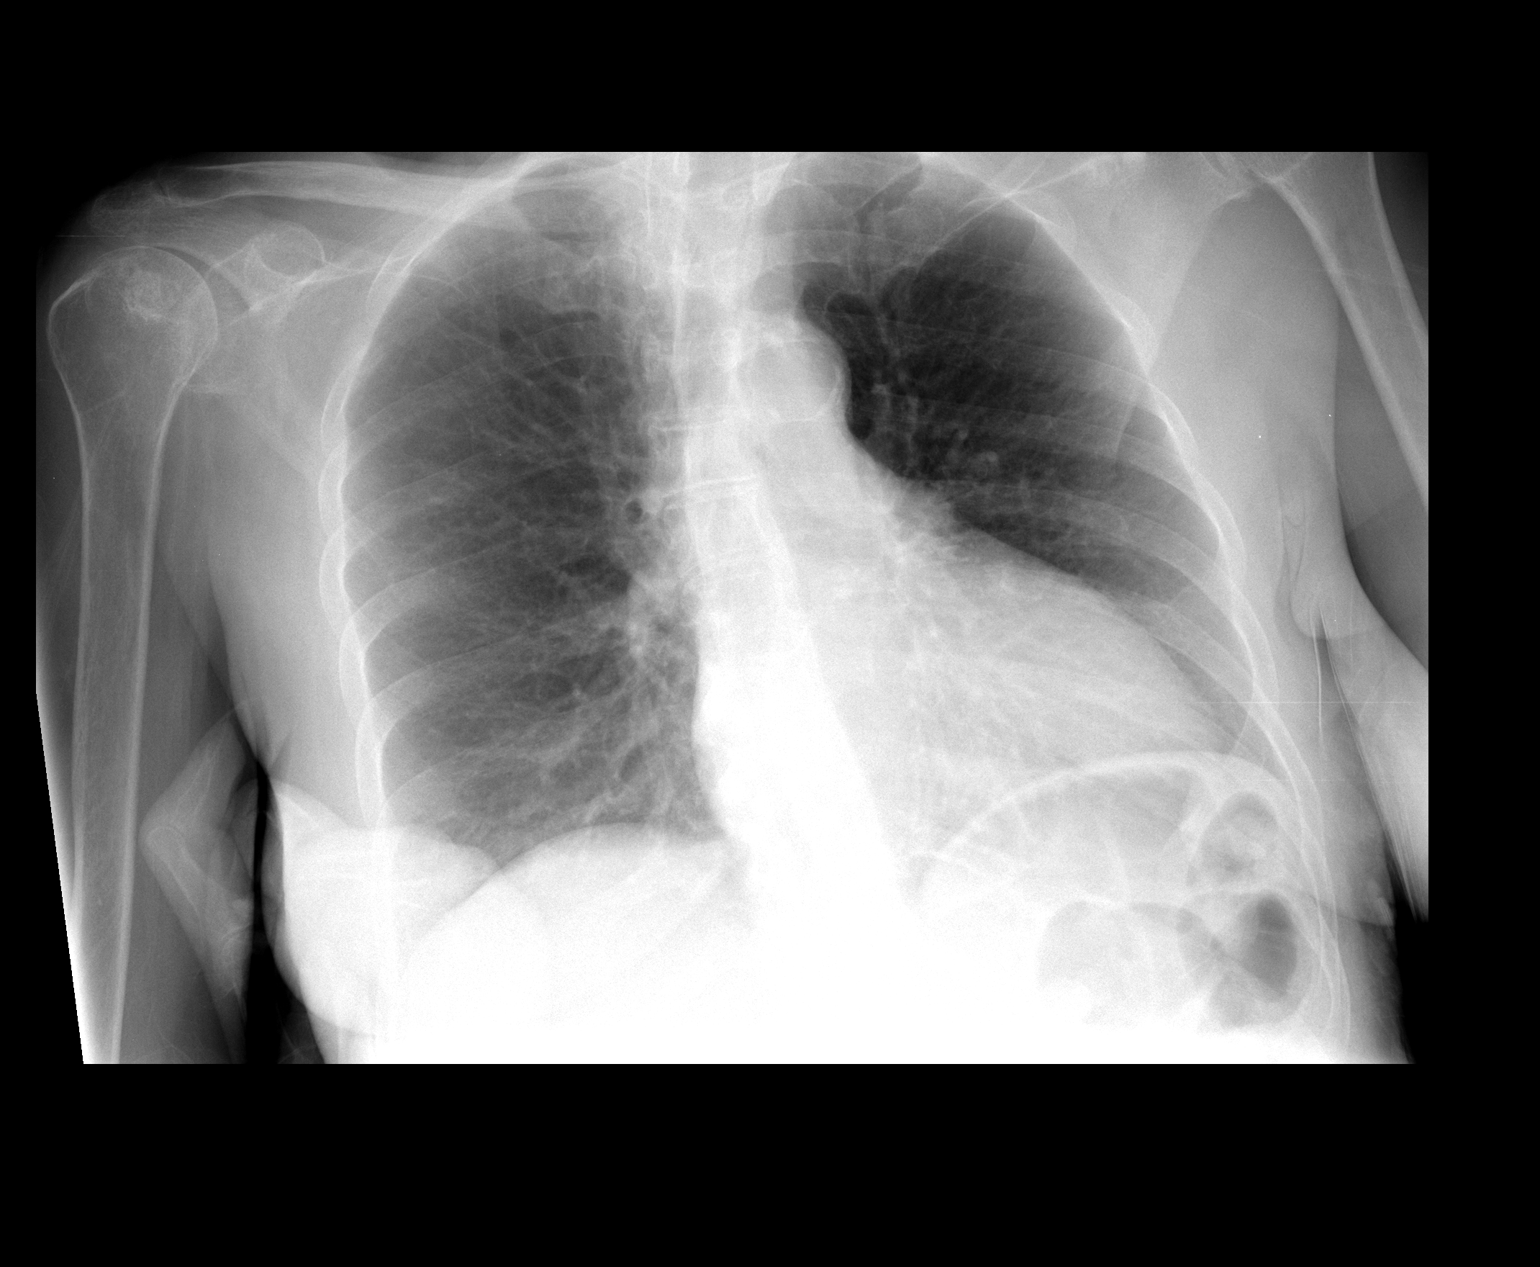

[1 of 1 positions shown; findings below may reference images not displayed]

FINDINGS: The lungs are clear without focal infiltrate, edema, pneumothorax or
pleural effusion. Cardiopericardial silhouette is at upper limits of
normal for size. Bones are diffusely demineralized.
IMPRESSION: No acute cardiopulmonary findings.

## 2015-10-29 IMAGING — CT CT HEAD W/O CM
2 series · 16 of 30 positions shown, 20 images · non-contrast
Comparison: 10/13/2010

CLINICAL DATA: SEIZURES

EXAM:
CT HEAD WITHOUT CONTRAST
TECHNIQUE: Contiguous axial images were obtained from the base of the skull
through the vertex without intravenous contrast.

[Series 201: head w/o, idose (1) · axial · non-contrast · 0.49mm/px · z∈[+52,+187]mm · 13 of 33 slices shown, 17 images]
[im 3/33  brain]
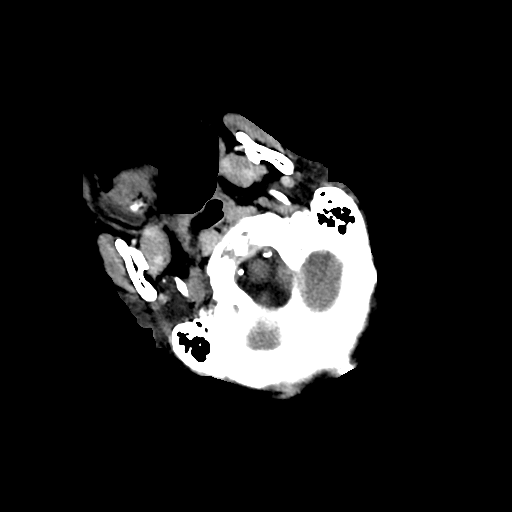
[im 3/33  bone]
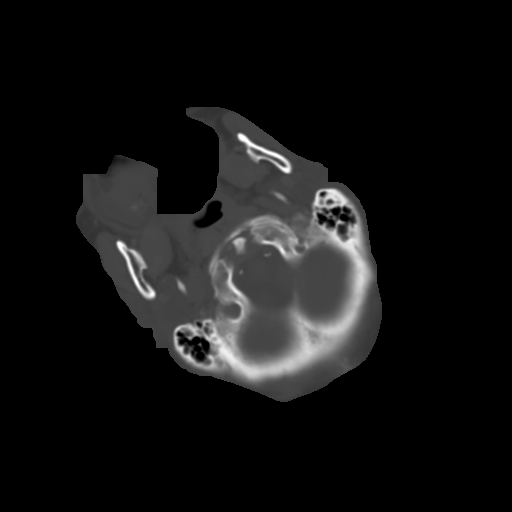
[im 5/33  brain]
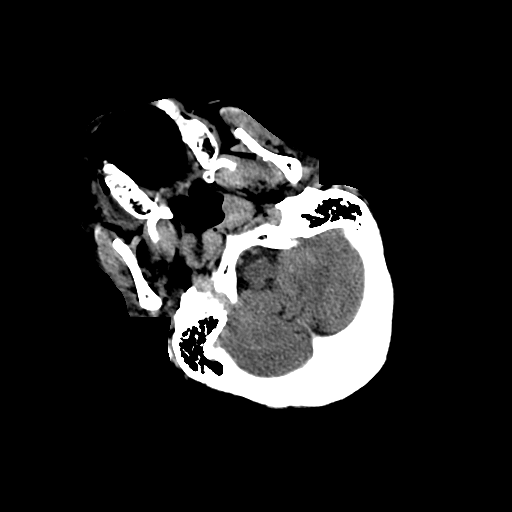
[im 7/33  brain]
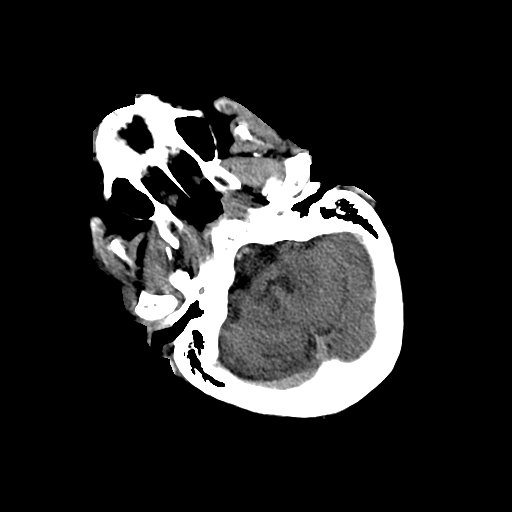
[im 10/33  brain]
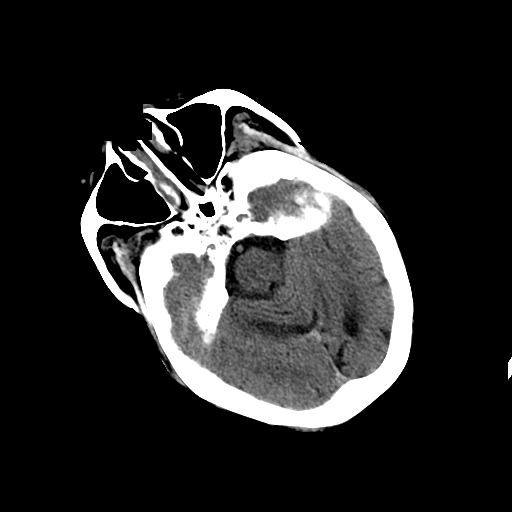
[im 12/33  brain]
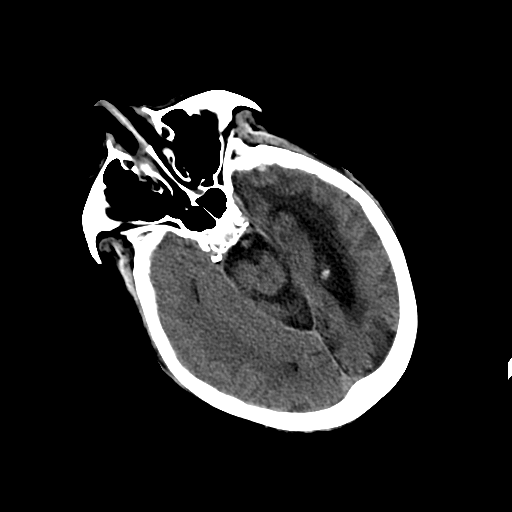
[im 12/33  bone]
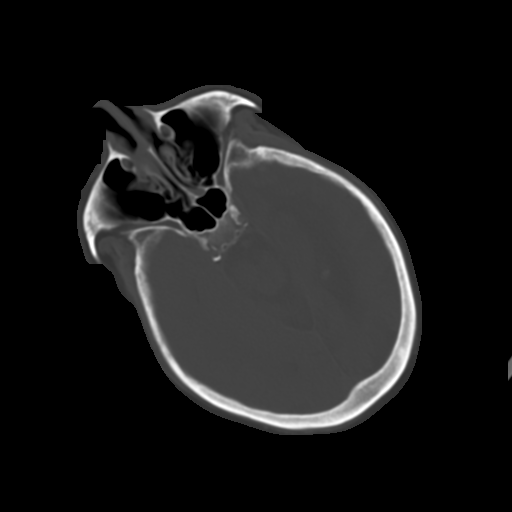
[im 14/33  brain]
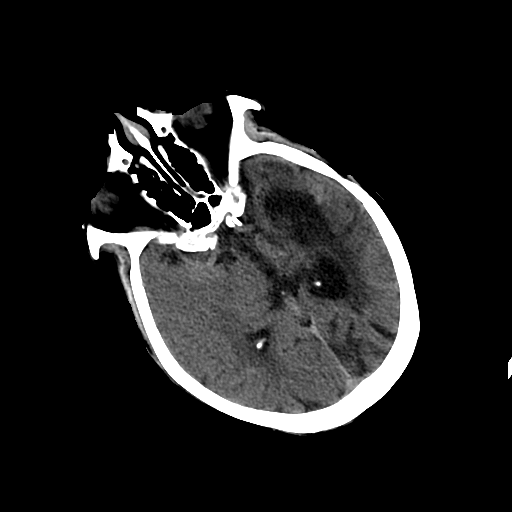
[im 17/33  brain]
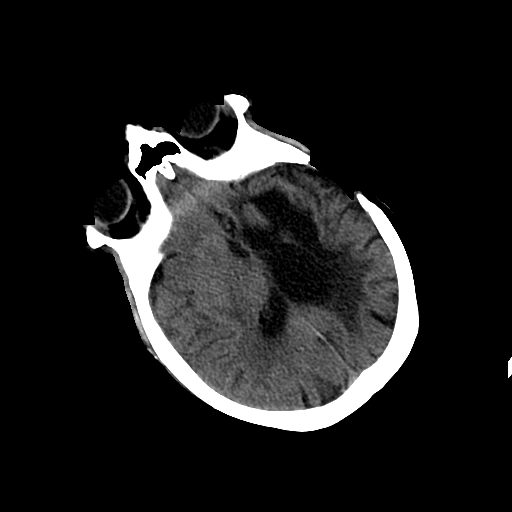
[im 19/33  brain]
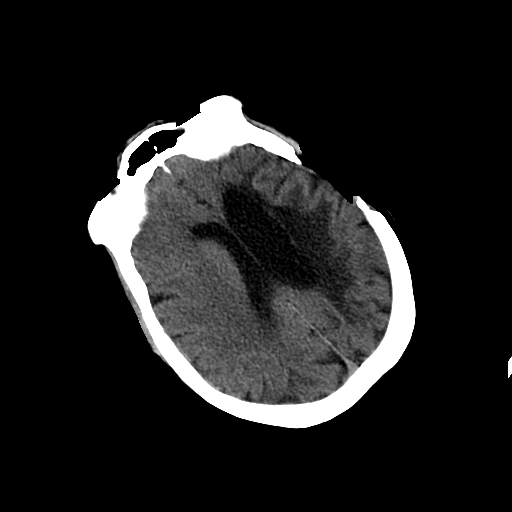
[im 21/33  brain]
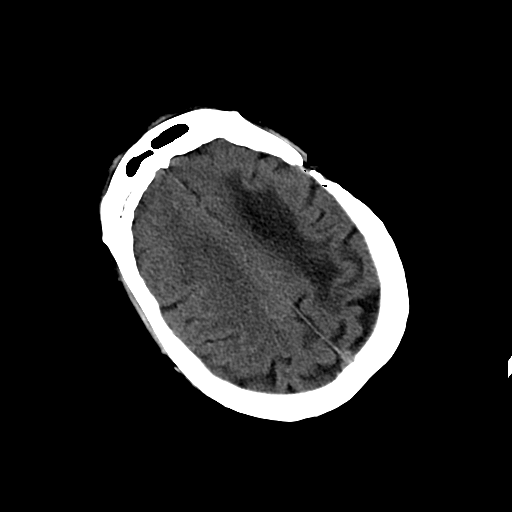
[im 21/33  bone]
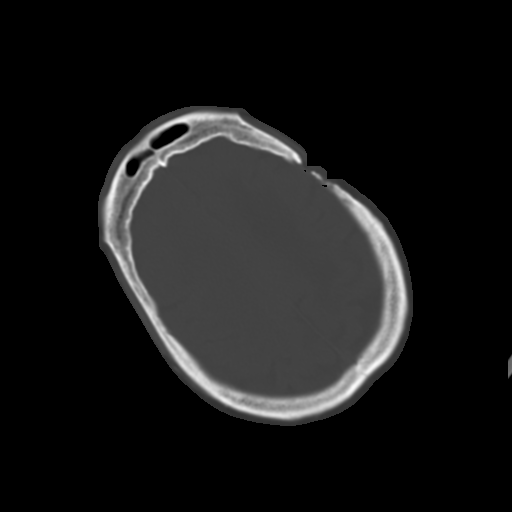
[im 23/33  brain]
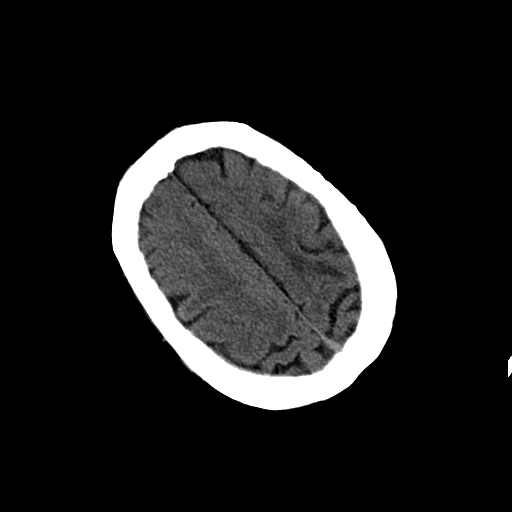
[im 26/33  brain]
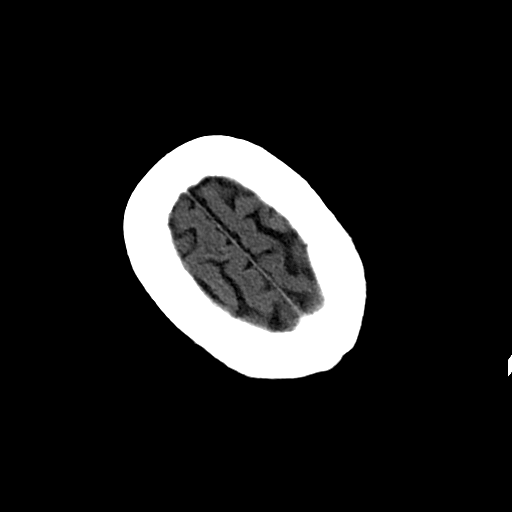
[im 28/33  brain]
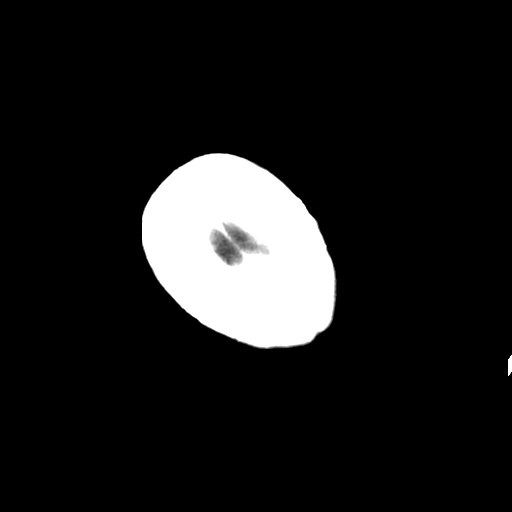
[im 30/33  brain]
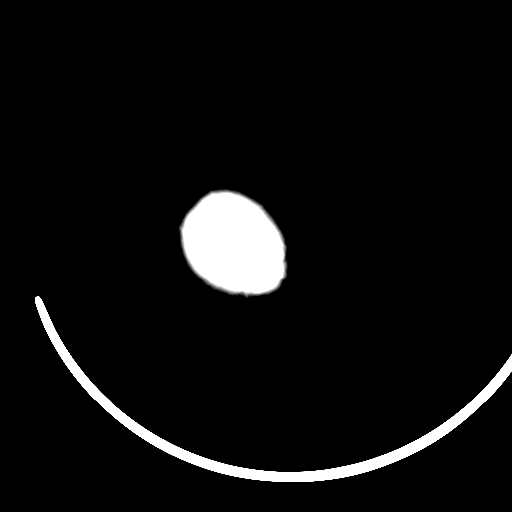
[im 30/33  bone]
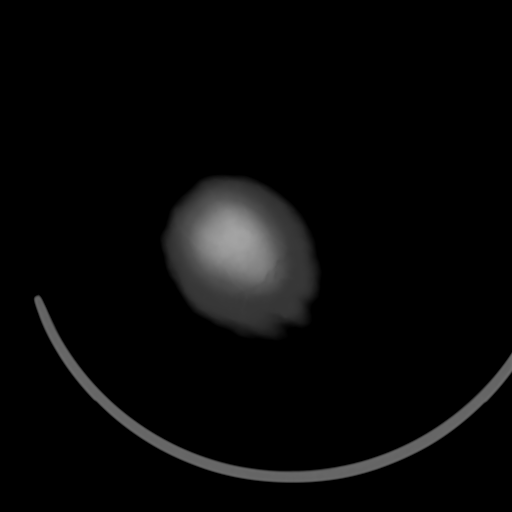

[Series 202: head w/o bone, idose (1) · axial · non-contrast · 0.49mm/px · z∈[+52,+97]mm · 3 of 33 slices shown]
[im 3/33  bone]
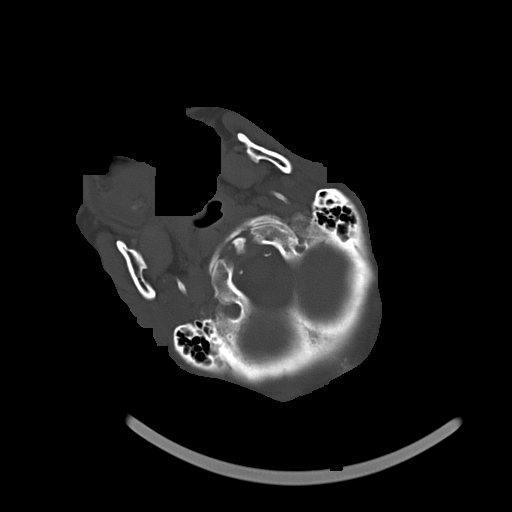
[im 7/33  bone]
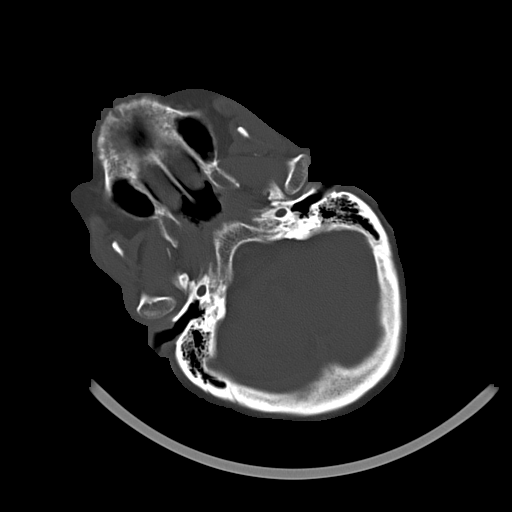
[im 12/33  bone]
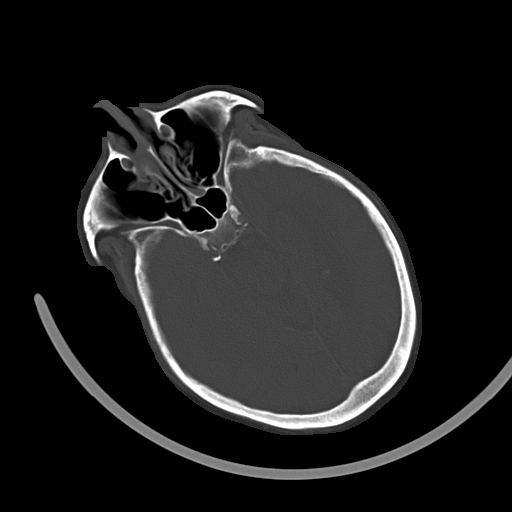

[16 of 30 positions shown; findings below may reference images not displayed]

FINDINGS: Atherosclerotic and physiologic intracranial calcifications.
Previous left parietal craniotomy. Stable encephalomalacia in the
left posterior frontal, temporal, and parietal regions .

There is no evidence of acute intracranial hemorrhage, brain edema,
mass lesion, acute infarction, mass effect, or midline shift. Acute
infarct may be inapparent on noncontrast CT. No other intra-axial
abnormalities are seen. No abnormal extra-axial fluid collections or
masses are identified. No significant calvarial abnormality.
IMPRESSION: 1. Negative for bleed or other acute intracranial process.
2. Changes of left parietal craniotomy with stable underlying
encephalomalacia.

## 2017-01-13 DEATH — deceased
